# Patient Record
Sex: Male | Born: 1975 | Race: Black or African American | Hispanic: No | Marital: Single | Smoking: Current every day smoker
Health system: Southern US, Community
[De-identification: ages and names within clinical notes are randomized; demographics above are authoritative.]

## PROBLEM LIST (undated history)

## (undated) DIAGNOSIS — J45909 Unspecified asthma, uncomplicated: Secondary | ICD-10-CM

## (undated) DIAGNOSIS — F1920 Other psychoactive substance dependence, uncomplicated: Secondary | ICD-10-CM

## (undated) HISTORY — PX: HAND SURGERY: SHX662

## (undated) HISTORY — PX: ANKLE SURGERY: SHX546

---

## 2005-03-05 ENCOUNTER — Emergency Department (HOSPITAL_COMMUNITY): Admission: EM | Admit: 2005-03-05 | Discharge: 2005-03-05 | Payer: Self-pay | Admitting: Emergency Medicine

## 2005-03-15 ENCOUNTER — Emergency Department (HOSPITAL_COMMUNITY): Admission: EM | Admit: 2005-03-15 | Discharge: 2005-03-15 | Payer: Self-pay | Admitting: Emergency Medicine

## 2006-12-02 ENCOUNTER — Emergency Department: Payer: Self-pay | Admitting: Emergency Medicine

## 2008-10-22 ENCOUNTER — Emergency Department: Payer: Self-pay | Admitting: Emergency Medicine

## 2008-10-26 ENCOUNTER — Ambulatory Visit: Payer: Self-pay | Admitting: Specialist

## 2008-12-13 ENCOUNTER — Emergency Department: Payer: Self-pay | Admitting: Emergency Medicine

## 2009-01-23 ENCOUNTER — Emergency Department: Payer: Self-pay | Admitting: Emergency Medicine

## 2009-02-24 ENCOUNTER — Emergency Department: Payer: Self-pay | Admitting: Emergency Medicine

## 2009-04-15 ENCOUNTER — Emergency Department: Payer: Self-pay | Admitting: Emergency Medicine

## 2009-04-23 ENCOUNTER — Emergency Department: Payer: Self-pay | Admitting: Emergency Medicine

## 2011-01-06 ENCOUNTER — Emergency Department: Payer: Self-pay | Admitting: Emergency Medicine

## 2011-10-03 ENCOUNTER — Emergency Department: Payer: Self-pay | Admitting: Emergency Medicine

## 2012-02-23 ENCOUNTER — Emergency Department: Payer: Self-pay | Admitting: Emergency Medicine

## 2012-03-15 ENCOUNTER — Emergency Department: Payer: Self-pay | Admitting: Emergency Medicine

## 2012-08-02 ENCOUNTER — Emergency Department: Payer: Self-pay | Admitting: Emergency Medicine

## 2014-07-27 ENCOUNTER — Encounter: Payer: Self-pay | Admitting: Emergency Medicine

## 2014-07-27 ENCOUNTER — Emergency Department
Admission: EM | Admit: 2014-07-27 | Discharge: 2014-07-27 | Disposition: A | Discharge status: Home / Self Care | Payer: Self-pay | Attending: Emergency Medicine | Admitting: Emergency Medicine

## 2014-07-27 ENCOUNTER — Emergency Department: Payer: Self-pay

## 2014-07-27 DIAGNOSIS — Y9389 Activity, other specified: Secondary | ICD-10-CM | POA: Insufficient documentation

## 2014-07-27 DIAGNOSIS — Z792 Long term (current) use of antibiotics: Secondary | ICD-10-CM | POA: Insufficient documentation

## 2014-07-27 DIAGNOSIS — J218 Acute bronchiolitis due to other specified organisms: Secondary | ICD-10-CM

## 2014-07-27 DIAGNOSIS — Z720 Tobacco use: Secondary | ICD-10-CM | POA: Insufficient documentation

## 2014-07-27 DIAGNOSIS — S91332A Puncture wound without foreign body, left foot, initial encounter: Secondary | ICD-10-CM | POA: Insufficient documentation

## 2014-07-27 DIAGNOSIS — Y9289 Other specified places as the place of occurrence of the external cause: Secondary | ICD-10-CM | POA: Insufficient documentation

## 2014-07-27 DIAGNOSIS — Z79899 Other long term (current) drug therapy: Secondary | ICD-10-CM | POA: Insufficient documentation

## 2014-07-27 DIAGNOSIS — S299XXA Unspecified injury of thorax, initial encounter: Secondary | ICD-10-CM | POA: Insufficient documentation

## 2014-07-27 DIAGNOSIS — J219 Acute bronchiolitis, unspecified: Secondary | ICD-10-CM | POA: Insufficient documentation

## 2014-07-27 DIAGNOSIS — W450XXA Nail entering through skin, initial encounter: Secondary | ICD-10-CM | POA: Insufficient documentation

## 2014-07-27 DIAGNOSIS — B9789 Other viral agents as the cause of diseases classified elsewhere: Secondary | ICD-10-CM

## 2014-07-27 DIAGNOSIS — R61 Generalized hyperhidrosis: Secondary | ICD-10-CM | POA: Insufficient documentation

## 2014-07-27 DIAGNOSIS — Y998 Other external cause status: Secondary | ICD-10-CM | POA: Insufficient documentation

## 2014-07-27 LAB — URINALYSIS COMPLETE WITH MICROSCOPIC (ARMC ONLY)
BACTERIA UA: NONE SEEN
Bilirubin Urine: NEGATIVE
GLUCOSE, UA: NEGATIVE mg/dL
HGB URINE DIPSTICK: NEGATIVE
KETONES UR: NEGATIVE mg/dL
Leukocytes, UA: NEGATIVE
NITRITE: NEGATIVE
Protein, ur: NEGATIVE mg/dL
RBC / HPF: NONE SEEN RBC/hpf (ref 0–5)
SPECIFIC GRAVITY, URINE: 1.014 (ref 1.005–1.030)
pH: 6 (ref 5.0–8.0)

## 2014-07-27 LAB — BASIC METABOLIC PANEL
Anion gap: 8 (ref 5–15)
BUN: 13 mg/dL (ref 6–20)
CHLORIDE: 104 mmol/L (ref 101–111)
CO2: 25 mmol/L (ref 22–32)
Calcium: 9.1 mg/dL (ref 8.9–10.3)
Creatinine, Ser: 1.03 mg/dL (ref 0.61–1.24)
GFR calc non Af Amer: >60 mL/min (ref >60)
GLUCOSE: 91 mg/dL (ref 65–99)
POTASSIUM: 3.9 mmol/L (ref 3.5–5.1)
Sodium: 137 mmol/L (ref 135–145)

## 2014-07-27 LAB — CBC WITH DIFFERENTIAL/PLATELET
BASOS ABS: 0.2 10*3/uL — AB (ref 0–0.1)
Basophils Relative: 3 %
Eosinophils Absolute: 0.2 10*3/uL (ref 0–0.7)
Eosinophils Relative: 4 %
HEMATOCRIT: 45.2 % (ref 40.0–52.0)
HEMOGLOBIN: 15 g/dL (ref 13.0–18.0)
LYMPHS ABS: 0.7 10*3/uL — AB (ref 1.0–3.6)
Lymphocytes Relative: 14 %
MCH: 31 pg (ref 26.0–34.0)
MCHC: 33.1 g/dL (ref 32.0–36.0)
MCV: 93.7 fL (ref 80.0–100.0)
MONO ABS: 0.8 10*3/uL (ref 0.2–1.0)
MONOS PCT: 15 %
NEUTROS ABS: 3.3 10*3/uL (ref 1.4–6.5)
NEUTROS PCT: 64 %
Platelets: 193 10*3/uL (ref 150–440)
RBC: 4.83 MIL/uL (ref 4.40–5.90)
RDW: 13.8 % (ref 11.5–14.5)
WBC: 5 10*3/uL (ref 3.8–10.6)

## 2014-07-27 LAB — RAPID INFLUENZA A&B ANTIGENS (ARMC ONLY)
INFLUENZA A (ARMC): NEGATIVE
INFLUENZA A (ARMC): NEGATIVE
INFLUENZA B (ARMC): NEGATIVE

## 2014-07-27 LAB — RAPID INFLUENZA A&B ANTIGENS: Influenza B (ARMC): NEGATIVE

## 2014-07-27 MED ORDER — FLUTICASONE PROPIONATE 50 MCG/ACT NA SUSP: 1.0000 | Freq: Every day | NASAL | Status: AC

## 2014-07-27 MED ORDER — IPRATROPIUM-ALBUTEROL 0.5-2.5 (3) MG/3ML IN SOLN
RESPIRATORY_TRACT | Status: AC
  Administered 2014-07-27: 3 mL via RESPIRATORY_TRACT
  Filled 2014-07-27: qty 3

## 2014-07-27 MED ORDER — AMOXICILLIN-POT CLAVULANATE 875-125 MG PO TABS: 1.0000 | ORAL_TABLET | Freq: Two times a day (BID) | ORAL | Status: AC

## 2014-07-27 MED ORDER — PREDNISONE 10 MG PO TABS: 10.0000 mg | ORAL_TABLET | Freq: Two times a day (BID) | ORAL | Status: AC

## 2014-07-27 MED ORDER — SODIUM CHLORIDE 0.9 % IV BOLUS (SEPSIS)
1000.0000 mL | Freq: Once | INTRAVENOUS | Status: AC
  Administered 2014-07-27: 1000 mL via INTRAVENOUS

## 2014-07-27 MED ORDER — ALBUTEROL SULFATE HFA 108 (90 BASE) MCG/ACT IN AERS: 2.0000 | INHALATION_SPRAY | Freq: Four times a day (QID) | RESPIRATORY_TRACT | Status: AC | PRN

## 2014-07-27 MED ORDER — BENZONATATE 100 MG PO CAPS: 100.0000 mg | ORAL_CAPSULE | Freq: Three times a day (TID) | ORAL | Status: AC | PRN

## 2014-07-27 MED ORDER — IPRATROPIUM-ALBUTEROL 0.5-2.5 (3) MG/3ML IN SOLN
3.0000 mL | Freq: Once | RESPIRATORY_TRACT | Status: AC
  Administered 2014-07-27: 3 mL via RESPIRATORY_TRACT

## 2014-07-27 NOTE — ED Notes (Signed)
States stepped on nail with left foot. Woke up with sore throat and cough

## 2014-07-27 NOTE — Discharge Instructions (Signed)
Puncture Wound A puncture wound happens when a sharp object pokes a hole in the skin. A puncture wound can cause an infection because germs can go under the skin during the injury. HOME CARE   Change your bandage (dressing) once a day, or as told by your doctor. If the bandage sticks, soak it in water.  Keep all doctor visits as told.  Only take medicine as told by your doctor.  Take your medicine (antibiotics) as told. Finish them even if you start to feel better. You may need a tetanus shot if:  You cannot remember when you had your last tetanus shot.  You have never had a tetanus shot. If you need a tetanus shot and you choose not to have one, you may get tetanus. Sickness from tetanus can be serious. You may need a rabies shot if an animal bite caused your wound. GET HELP RIGHT AWAY IF:   Your wound is red, puffy (swollen), or painful.  You see red lines on the skin near the wound.  You have a bad smell coming from the wound or bandage.  You have yellowish-white fluid (pus) coming from the wound.  Your medicine is not working.  You notice an object in the wound.  You have a fever.  You have severe pain.  You have trouble breathing.  You feel dizzy or pass out (faint).  You keep throwing up (vomiting).  You lose feeling (numbness) in your arm or leg, or you cannot move your arm or leg.  Your problems get worse. MAKE SURE YOU:   Understand these instructions.  Will watch your condition.  Will get help right away if you are not doing well or get worse. Document Released: 12/11/2007 Document Revised: 05/26/2011 Document Reviewed: 08/20/2010 [facility] Patient Information 2015 [place]. This information is not intended to replace advice given to you by your health care provider. Make sure you discuss any questions you have with your health care provider.  Upper Respiratory Infection, Adult An upper respiratory infection (URI) is also known as the common  cold. It is often caused by a type of germ (virus). Colds are easily spread (contagious). You can pass it to others by kissing, coughing, sneezing, or drinking out of the same glass. Usually, you get better in 1 or 2 weeks.  HOME CARE   Only take medicine as told by your doctor.  Use a warm mist humidifier or breathe in steam from a hot shower.  Drink enough water and fluids to keep your pee (urine) clear or pale yellow.  Get plenty of rest.  Return to work when your temperature is back to normal or as told by your doctor. You may use a face mask and wash your hands to stop your cold from spreading. GET HELP RIGHT AWAY IF:   After the first few days, you feel you are getting worse.  You have questions about your medicine.  You have chills, shortness of breath, or brown or red spit (mucus).  You have yellow or brown snot (nasal discharge) or pain in the face, especially when you bend forward.  You have a fever, puffy (swollen) neck, pain when you swallow, or white spots in the back of your throat.  You have a bad headache, ear pain, sinus pain, or chest pain.  You have a high-pitched whistling sound when you breathe in and out (wheezing).  You have a lasting cough or cough up blood.  You have sore muscles or a stiff  neck. MAKE SURE YOU:   Understand these instructions.  Will watch your condition.  Will get help right away if you are not doing well or get worse. Document Released: 08/20/2007 Document Revised: 05/26/2011 Document Reviewed: 06/08/2013 [facility] Patient Information 2015 [place]. This information is not intended to replace advice given to you by your health care provider. Make sure you discuss any questions you have with your health care provider.  Take the prescription meds as directed.  [facility] with your provider or [facility] as needed. Increase fluids to prevent dehydration.

## 2014-07-27 NOTE — ED Provider Notes (Signed)
[facility] Provider [facility]? ____________________________________________ ? Time seen: 1141 ? I have reviewed the triage vital signs and the nursing notes. ________ HISTORY ? Chief Complaint URI  HPI  Johnny Thompson is a 39 y.o. male who reports to the ED with 2 separate complaints. First he notes injury to the plantar surface of his left foot, after he stepped on a nail, while mowing the lawn on Tuesday. He notes a small wound to the bottom of the foot between the second and third toes. Bleeding is controlled he notes some minimal pain there and also reports that his tetanus is up-to-date. Secondly, he notes sudden onset on Wednesday morning of URI symptoms. He reports sore throat, cough, chest discomfort, and cold sweats. Gives a history of bronchitis in his past. He denies any fever, chills, sweats, but reports at least 2 episodes of loose bowel movements.  Review of Systems  Constitutional: Negative for fever. Eyes: Negative for visual changes. ENT: Negative for sore throat. Cardiovascular: Negative for chest pain. Respiratory: Negative for shortness of breath. Gastrointestinal: Negative for abdominal pain, vomiting and positive for diarrhea. Musculoskeletal: Negative for back pain. Skin: Negative for rash. Positive for foot puncture wound. Neurological: Negative for headaches, focal weakness or numbness.  10-point ROS otherwise negative. ____________________________________________  History reviewed. No pertinent past medical history.  There are no active problems to display for this patient.  History reviewed. No pertinent past surgical history. ? Current Outpatient Rx  Name  Route  Sig  Dispense  Refill  . albuterol (PROVENTIL HFA;VENTOLIN HFA) 108 (90 BASE) MCG/ACT inhaler   Inhalation   Inhale 2 puffs into the lungs every 6 (six) hours as needed for wheezing or shortness of breath.   1 Inhaler   0   .  amoxicillin-clavulanate (AUGMENTIN) 875-125 MG per tablet   Oral   Take 1 tablet by mouth 2 (two) times daily.   20 tablet   0   . benzonatate (TESSALON PERLES) 100 MG capsule   Oral   Take 1 capsule (100 mg total) by mouth 3 (three) times daily as needed for cough (Take 1-2 per dose).   30 capsule   0   . fluticasone (FLONASE) 50 MCG/ACT nasal spray   Each Nare   Place 1 spray into both nostrils daily.   16 g   0   . predniSONE (DELTASONE) 10 MG tablet   Oral   Take 1 tablet (10 mg total) by mouth 2 (two) times daily with a meal.   10 tablet   0   ? Allergies Review of patient's allergies indicates no known allergies. ? History reviewed. No pertinent family history. ? Social History History  Substance Use Topics  . Smoking status: Current Every Day Smoker  . Smokeless tobacco: Not on file  . Alcohol Use: Yes     Comment: occassional   PHYSICAL EXAM:  VITAL SIGNS: ED Triage Vitals  Enc Vitals Group     BP 07/27/14 1028 131/69 mmHg     Pulse Rate 07/27/14 1028 76     Resp 07/27/14 1028 20     Temp 07/27/14 1028 98.1 F (36.7 C)     Temp Source 07/27/14 1028 Oral     SpO2 07/27/14 1028 99 %     Weight 07/27/14 1028 190 lb (86.183 kg)     Height 07/27/14 1028 5\' 11"  (1.803 m)     Head Cir --      Peak Flow --  Pain Score 07/27/14 1029 8     Pain Loc --      Pain Edu? --      Excl. in GC? --    Constitutional: Alert and oriented. Well appearing and in no distress. Eyes: Conjunctivae are normal. PERRL. Normal extraocular movements. ENT   Head: Normocephalic and atraumatic.   Nose: No congestion/rhinnorhea.   Mouth/Throat: Mucous membranes are moist.      Ears: Normal external exam. Canals clear. TMs clear bilaterally.   Neck: Supple. No lymphadenopathy. Cardiovascular: Normal rate, regular rhythm. Normal and symmetric distal pulses are present in all extremities. No murmurs, rubs, or gallops. Respiratory: Normal respiratory effort without  tachypnea nor retractions. Breath sounds are clear and equal bilaterally. No wheezes/rales/rhonchi. Gastrointestinal: Soft and nontender.  Musculoskeletal: Normal range of motion in all extremities.  Neurologic:  Normal speech and language. No gross focal neurologic deficits are appreciated.  Skin:  Skin is warm, dry and intact. No rash noted. Left plantar surface with 0.5cm wound with local honey-crusted scabbing noted.  No local erythema, phlebitis, or lymphangitis.  Psychiatric: Mood and affect are normal. Patient exhibits appropriate insight and judgment. ___________ RADIOLOGY  IMPRESSION: Slight bronchitic changes. _____________ PROCEDURES ? Procedure(s) performed: DuoNeb x 1             NS 1000 mg bolus x 1 Critical Care performed: No _______________________________  Labs Reviewed  URINALYSIS COMPLETEWITH MICROSCOPIC ([facility])  - Abnormal; Notable for the following:    Color, Urine YELLOW (*)    APPearance CLEAR (*)    Squamous Epithelial / LPF 0-5 (*)    All other components within normal limits  CBC WITH DIFFERENTIAL/PLATELET - Abnormal; Notable for the following:    Lymphs Abs 0.7 (*)    Basophils Absolute 0.2 (*)    All other components within normal limits  INFLUENZA A&B ANTIGENS([facility])  BASIC METABOLIC PANEL  _______________________ INITIAL IMPRESSION / ASSESSMENT AND PLAN / ED COURSE  Negative CXR and lab, and flu test results to patient. Patient reports improvement to symptoms following breathing treatment and fluid bolus.  Discussed treatment and management of puncture wound with antibiotic and wound care.  [facility] to [facility] or return as needed.  Pertinent labs & imaging results that were available during my care of the patient were reviewed by me and considered in my medical decision making (see chart for details).  ___________________________________________ FINAL CLINICAL IMPRESSION(S) / ED DIAGNOSES?  Final diagnoses:  Puncture wound of  foot, left, initial encounter  Acute viral bronchiolitis      Lissa HoardJenise V Bacon Allicia Culley, PA-C 07/27/14 1609  Darien Ramusavid W Kaminski, MD 08/01/14 [phone]

## 2014-07-27 NOTE — ED Notes (Signed)
Patient transported to X-ray 

## 2015-03-17 ENCOUNTER — Emergency Department
Admission: EM | Admit: 2015-03-17 | Discharge: 2015-03-17 | Disposition: A | Discharge status: Home / Self Care | Payer: Self-pay | Attending: Emergency Medicine | Admitting: Emergency Medicine

## 2015-03-17 ENCOUNTER — Emergency Department: Payer: Self-pay

## 2015-03-17 ENCOUNTER — Encounter: Payer: Self-pay | Admitting: Emergency Medicine

## 2015-03-17 DIAGNOSIS — S9031XA Contusion of right foot, initial encounter: Secondary | ICD-10-CM | POA: Insufficient documentation

## 2015-03-17 DIAGNOSIS — Y998 Other external cause status: Secondary | ICD-10-CM | POA: Insufficient documentation

## 2015-03-17 DIAGNOSIS — Y9389 Activity, other specified: Secondary | ICD-10-CM | POA: Insufficient documentation

## 2015-03-17 DIAGNOSIS — S060X9A Concussion with loss of consciousness of unspecified duration, initial encounter: Secondary | ICD-10-CM | POA: Insufficient documentation

## 2015-03-17 DIAGNOSIS — Z7952 Long term (current) use of systemic steroids: Secondary | ICD-10-CM | POA: Insufficient documentation

## 2015-03-17 DIAGNOSIS — S60221A Contusion of right hand, initial encounter: Secondary | ICD-10-CM | POA: Insufficient documentation

## 2015-03-17 DIAGNOSIS — Z7951 Long term (current) use of inhaled steroids: Secondary | ICD-10-CM | POA: Insufficient documentation

## 2015-03-17 DIAGNOSIS — F17200 Nicotine dependence, unspecified, uncomplicated: Secondary | ICD-10-CM | POA: Insufficient documentation

## 2015-03-17 DIAGNOSIS — S4992XA Unspecified injury of left shoulder and upper arm, initial encounter: Secondary | ICD-10-CM | POA: Insufficient documentation

## 2015-03-17 DIAGNOSIS — Z792 Long term (current) use of antibiotics: Secondary | ICD-10-CM | POA: Insufficient documentation

## 2015-03-17 DIAGNOSIS — S0990XA Unspecified injury of head, initial encounter: Secondary | ICD-10-CM

## 2015-03-17 DIAGNOSIS — Y92481 Parking lot as the place of occurrence of the external cause: Secondary | ICD-10-CM | POA: Insufficient documentation

## 2015-03-17 DIAGNOSIS — S0083XA Contusion of other part of head, initial encounter: Secondary | ICD-10-CM | POA: Insufficient documentation

## 2015-03-17 DIAGNOSIS — S199XXA Unspecified injury of neck, initial encounter: Secondary | ICD-10-CM | POA: Insufficient documentation

## 2015-03-17 LAB — BASIC METABOLIC PANEL
ANION GAP: 11 (ref 5–15)
BUN: 21 mg/dL — AB (ref 6–20)
CHLORIDE: 109 mmol/L (ref 101–111)
CO2: 21 mmol/L — ABNORMAL LOW (ref 22–32)
Calcium: 9 mg/dL (ref 8.9–10.3)
Creatinine, Ser: 1.25 mg/dL — ABNORMAL HIGH (ref 0.61–1.24)
GFR calc Af Amer: >60 mL/min (ref >60)
GFR calc non Af Amer: >60 mL/min (ref >60)
Glucose, Bld: 88 mg/dL (ref 65–99)
POTASSIUM: 3.9 mmol/L (ref 3.5–5.1)
SODIUM: 141 mmol/L (ref 135–145)

## 2015-03-17 LAB — CBC WITH DIFFERENTIAL/PLATELET
BASOS PCT: 1 %
Basophils Absolute: 0.1 10*3/uL (ref 0–0.1)
EOS PCT: 4 %
Eosinophils Absolute: 0.3 10*3/uL (ref 0–0.7)
HCT: 47.4 % (ref 40.0–52.0)
Hemoglobin: 15.9 g/dL (ref 13.0–18.0)
Lymphocytes Relative: 32 %
Lymphs Abs: 2.5 10*3/uL (ref 1.0–3.6)
MCH: 31.2 pg (ref 26.0–34.0)
MCHC: 33.5 g/dL (ref 32.0–36.0)
MCV: 93.1 fL (ref 80.0–100.0)
MONO ABS: 0.7 10*3/uL (ref 0.2–1.0)
MONOS PCT: 10 %
NEUTROS ABS: 4 10*3/uL (ref 1.4–6.5)
Neutrophils Relative %: 53 %
Platelets: 232 10*3/uL (ref 150–440)
RBC: 5.09 MIL/uL (ref 4.40–5.90)
RDW: 14 % (ref 11.5–14.5)
WBC: 7.6 10*3/uL (ref 3.8–10.6)

## 2015-03-17 MED ORDER — OXYCODONE-ACETAMINOPHEN 5-325 MG PO TABS: 1.0000 | ORAL_TABLET | Freq: Once | ORAL | Status: DC

## 2015-03-17 MED ORDER — SODIUM CHLORIDE 0.9 % IV BOLUS (SEPSIS)
1000.0000 mL | Freq: Once | INTRAVENOUS | Status: AC
  Administered 2015-03-17: 1000 mL via INTRAVENOUS

## 2015-03-17 MED ORDER — FENTANYL CITRATE (PF) 100 MCG/2ML IJ SOLN
100.0000 ug | Freq: Once | INTRAMUSCULAR | Status: AC
  Administered 2015-03-17: 100 ug via INTRAVENOUS
  Filled 2015-03-17: qty 2

## 2015-03-17 MED ORDER — OXYCODONE-ACETAMINOPHEN 5-325 MG PO TABS: 1.0000 | ORAL_TABLET | Freq: Four times a day (QID) | ORAL | Status: AC | PRN
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
## 2015-03-17 NOTE — ED Notes (Signed)
Pt refused shoulder x-ray
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
## 2015-03-17 NOTE — Discharge Instructions (Signed)
Contusion A contusion is a deep bruise. Contusions are the result of a blunt injury to tissues and muscle fibers under the skin. The injury causes bleeding under the skin. The skin overlying the contusion may turn blue, purple, or yellow. Minor injuries will give you a painless contusion, but more severe contusions may stay painful and swollen for a few weeks.  CAUSES  This condition is usually caused by a blow, trauma, or direct force to an area of the body. SYMPTOMS  Symptoms of this condition include:  Swelling of the injured area.  Pain and tenderness in the injured area.  Discoloration. The area may have redness and then turn blue, purple, or yellow. DIAGNOSIS  This condition is diagnosed based on a physical exam and medical history. An X-ray, CT scan, or MRI may be needed to determine if there are any associated injuries, such as broken bones (fractures). TREATMENT  Specific treatment for this condition depends on what area of the body was injured. In general, the best treatment for a contusion is resting, icing, applying pressure to (compression), and elevating the injured area. This is often called the RICE strategy. Over-the-counter anti-inflammatory medicines may also be recommended for pain control.  HOME CARE INSTRUCTIONS   Rest the injured area.  If directed, apply ice to the injured area:  Put ice in a plastic bag.  Place a towel between your skin and the bag.  Leave the ice on for 20 minutes, 2-3 times per day.  If directed, apply light compression to the injured area using an elastic bandage. Make sure the bandage is not wrapped too tightly. Remove and reapply the bandage as directed by your health care provider.  If possible, raise (elevate) the injured area above the level of your heart while you are sitting or lying down.  Take over-the-counter and prescription medicines only as told by your health care provider. SEEK MEDICAL CARE IF:  Your symptoms do not  improve after several days of treatment.  Your symptoms get worse.  You have difficulty moving the injured area. SEEK IMMEDIATE MEDICAL CARE IF:   You have severe pain.  You have numbness in a hand or foot.  Your hand or foot turns pale or cold.   This information is not intended to replace advice given to you by your health care provider. Make sure you discuss any questions you have with your health care provider.   Document Released: 12/11/2004 Document Revised: 11/22/2014 Document Reviewed: 07/19/2014 Elsevier Interactive Patient Education 2016 [employer].  Concussion, Adult A concussion is a brain injury. It is caused by:  A hit to the head.  A quick and sudden movement (jolt) of the head or neck. A concussion is usually not life threatening. Even so, it can cause serious problems. If you had a concussion before, you may have concussion-like problems after a hit to your head. HOME CARE General Instructions  Follow your doctor's directions carefully.  Take medicines only as told by your doctor.  Only take medicines your doctor says are safe.  Do not drink alcohol until your doctor says it is okay. Alcohol and some drugs can slow down healing. They can also put you at risk for further injury.  If you are having trouble remembering things, write them down.  Try to do one thing at a time if you get distracted easily. For example, do not watch TV while making dinner.  Talk to your family members or close friends when making important decisions.  Follow up with your doctor as told.  Watch your symptoms. Tell others to do the same. Serious problems can sometimes happen after a concussion. Older adults are more likely to have these problems.  Tell your teachers, school nurse, school counselor, coach, Event organiserathletic trainer, or work Production designer, theatre/television/filmmanager about your concussion. Tell them about what you can or cannot do. They should watch to see if:  It gets even harder for you to pay  attention or concentrate.  It gets even harder for you to remember things or learn new things.  You need more time than normal to finish things.  You become annoyed (irritable) more than before.  You are not able to deal with stress as well.  You have more problems than before.  Rest. Make sure you:  Get plenty of sleep at night.  Go to sleep early.  Go to bed at the same time every day. Try to wake up at the same time.  Rest during the day.  Take naps when you feel tired.  Limit activities where you have to think a lot or concentrate. These include:  Doing homework.  Doing work related to a job.  Watching TV.  Using the computer. Returning To Your Regular Activities Return to your normal activities slowly, not all at once. You must give your body and brain enough time to heal.   Do not play sports or do other athletic activities until your doctor says it is okay.  Ask your doctor when you can drive, ride a bicycle, or work other vehicles or machines. Never do these things if you feel dizzy.  Ask your doctor about when you can return to work or school. Preventing Another Concussion It is very important to avoid another brain injury, especially before you have healed. In rare cases, another injury can lead to permanent brain damage, brain swelling, or death. The risk of this is greatest during the first 7-10 days after your injury. Avoid injuries by:   Wearing a seat belt when riding in a car.  Not drinking too much alcohol.  Avoiding activities that could lead to a second concussion (such as contact sports).  Wearing a helmet when doing activities like:  Biking.  Skiing.  Skateboarding.  Skating.  Making your home safer by:  Removing things from the floor or stairways that could make you trip.  Using grab bars in bathrooms and handrails by stairs.  Placing non-slip mats on floors and in bathtubs.  Improve lighting in dark areas. GET HELP IF:  It  gets even harder for you to pay attention or concentrate.  It gets even harder for you to remember things or learn new things.  You need more time than normal to finish things.  You become annoyed (irritable) more than before.  You are not able to deal with stress as well.  You have more problems than before.  You have problems keeping your balance.  You are not able to react quickly when you should. Get help if you have any of these problems for more than 2 weeks:   Lasting (chronic) headaches.  Dizziness or trouble balancing.  Feeling sick to your stomach (nausea).  Seeing (vision) problems.  Being affected by noises or light more than normal.  Feeling sad, low, down in the dumps, blue, gloomy, or empty (depressed).  Mood changes (mood swings).  Feeling of fear or nervousness about what may happen (anxiety).  Feeling annoyed.  Memory problems.  Problems concentrating or paying attention.  Sleep problems.  Feeling tired all the time. GET HELP RIGHT AWAY IF:   You have bad headaches or your headaches get worse.  You have weakness (even if it is in one hand, leg, or part of the face).  You have loss of feeling (numbness).  You feel off balance.  You keep throwing up (vomiting).  You feel tired.  One black center of your eye (pupil) is larger than the other.  You twitch or shake violently (convulse).  Your speech is not clear (slurred).  You are more confused, easily angered (agitated), or annoyed than before.  You have more trouble resting than before.  You are unable to recognize people or places.  You have neck pain.  It is difficult to wake you up.  You have unusual behavior changes.  You pass out (lose consciousness). MAKE SURE YOU:   Understand these instructions.  Will watch your condition.  Will get help right away if you are not doing well or get worse.   This information is not intended to replace advice given to you by your  health care provider. Make sure you discuss any questions you have with your health care provider.   Document Released: 02/19/2009 Document Revised: 03/24/2014 Document Reviewed: 09/23/2012 Elsevier Interactive Patient Education [employer]

## 2017-02-06 ENCOUNTER — Emergency Department: Payer: Self-pay

## 2017-02-06 ENCOUNTER — Other Ambulatory Visit: Payer: Self-pay

## 2017-02-06 ENCOUNTER — Emergency Department
Admission: EM | Admit: 2017-02-06 | Discharge: 2017-02-06 | Disposition: A | Discharge status: Home / Self Care | Payer: Self-pay | Attending: Emergency Medicine | Admitting: Emergency Medicine

## 2017-02-06 ENCOUNTER — Encounter: Payer: Self-pay | Admitting: *Deleted

## 2017-02-06 DIAGNOSIS — F17210 Nicotine dependence, cigarettes, uncomplicated: Secondary | ICD-10-CM | POA: Insufficient documentation

## 2017-02-06 DIAGNOSIS — R079 Chest pain, unspecified: Secondary | ICD-10-CM | POA: Insufficient documentation

## 2017-02-06 DIAGNOSIS — J45909 Unspecified asthma, uncomplicated: Secondary | ICD-10-CM | POA: Insufficient documentation

## 2017-02-06 DIAGNOSIS — Z79899 Other long term (current) drug therapy: Secondary | ICD-10-CM | POA: Insufficient documentation

## 2017-02-06 HISTORY — DX: Unspecified asthma, uncomplicated: J45.909

## 2017-02-06 HISTORY — DX: Other psychoactive substance dependence, uncomplicated: F19.20

## 2017-02-06 LAB — CBC
HEMATOCRIT: 47.9 % (ref 40.0–52.0)
Hemoglobin: 16.3 g/dL (ref 13.0–18.0)
MCH: 31.4 pg (ref 26.0–34.0)
MCHC: 34 g/dL (ref 32.0–36.0)
MCV: 92.4 fL (ref 80.0–100.0)
PLATELETS: 249 10*3/uL (ref 150–440)
RBC: 5.18 MIL/uL (ref 4.40–5.90)
RDW: 13.8 % (ref 11.5–14.5)
WBC: 4.8 10*3/uL (ref 3.8–10.6)

## 2017-02-06 LAB — URINE DRUG SCREEN, QUALITATIVE (ARMC ONLY)
Amphetamines, Ur Screen: NOT DETECTED
BARBITURATES, UR SCREEN: NOT DETECTED
BENZODIAZEPINE, UR SCRN: NOT DETECTED
Cannabinoid 50 Ng, Ur ~~LOC~~: POSITIVE — AB
Cocaine Metabolite,Ur ~~LOC~~: POSITIVE — AB
MDMA (Ecstasy)Ur Screen: NOT DETECTED
Methadone Scn, Ur: NOT DETECTED
Opiate, Ur Screen: NOT DETECTED
Phencyclidine (PCP) Ur S: NOT DETECTED
TRICYCLIC, UR SCREEN: NOT DETECTED

## 2017-02-06 LAB — BASIC METABOLIC PANEL
Anion gap: 9 (ref 5–15)
BUN: 21 mg/dL — ABNORMAL HIGH (ref 6–20)
CO2: 22 mmol/L (ref 22–32)
CREATININE: 1 mg/dL (ref 0.61–1.24)
Calcium: 9.1 mg/dL (ref 8.9–10.3)
Chloride: 109 mmol/L (ref 101–111)
GFR calc Af Amer: >60 mL/min (ref >60)
Glucose, Bld: 107 mg/dL — ABNORMAL HIGH (ref 65–99)
POTASSIUM: 4.3 mmol/L (ref 3.5–5.1)
SODIUM: 140 mmol/L (ref 135–145)

## 2017-02-06 LAB — TROPONIN I: Troponin I: <0.03 ng/mL (ref <0.03)

## 2017-02-06 NOTE — ED Notes (Signed)
Pt states that he was drinking and smoking reefer and did not feel right after doing so. He reports that he developed chest pain and has not been able to sleep since 0400 this am. He states that he usually does this but this is the first time it had made him feel bad. He is anxious.

## 2017-02-06 NOTE — ED Triage Notes (Signed)
Pt c/o chest pain, throat swelling, dyspnea, cough. Pt states sxs started at 0400. Pt states he was drinking ETOH, cocaine, marijuana and some pill of unknown type and origin. Pt states heart racing, throat swelling, cough and chest pain w/ inspiration all started at the same time. Pt has not slept since sxs started.

## 2017-02-06 NOTE — Discharge Instructions (Signed)
Please seek medical attention for any high fevers, chest pain, shortness of breath, change in behavior, persistent vomiting, bloody stool or any other new or concerning symptoms.  

## 2017-02-06 NOTE — ED Provider Notes (Signed)
[facility] Emergency Department Provider Note ___________________________________   I have reviewed the triage vital signs and the nursing notes.   HISTORY  Chief Complaint Chest Pain   History limited by: Not Limited   HPI Johnny Thompson is a 41 y.o. male who presents to the emergency department today because of concern for chest pain.   LOCATION:left chest DURATION:~13 hours TIMING: constant SEVERITY: severe QUALITY: pressure CONTEXT: patient states that he was drinking, smoking marijuana and using cocaine last night. Also took some unknown pills. States he also has heart burn. MODIFYING FACTORS: worse with deep breaths, worse with turning onto his left side ASSOCIATED SYMPTOMS: denies any shortness of breath. Denies nausea and vomiting. Denies fever.  Per medical record review patient has a history of polysubstance abuse.   Past Medical History:  Diagnosis Date  . Asthma   . Polysubstance (excluding opioids) dependence (HCC)     There are no active problems to display for this patient.   Past Surgical History:  Procedure Laterality Date  . ANKLE SURGERY    . HAND SURGERY      Prior to Admission medications   Medication Sig Start Date End Date Taking? Authorizing Provider  acetaminophen (TYLENOL) 500 MG tablet Take 1,000 mg by mouth every 6 (six) hours as needed for mild pain or moderate pain.    [provider]  albuterol (PROVENTIL HFA;VENTOLIN HFA) 108 (90 BASE) MCG/ACT inhaler Inhale 2 puffs into the lungs every 6 (six) hours as needed for wheezing or shortness of breath. 07/27/14   Menshew, Charlesetta IvoryJenise V Bacon, PA-C  amoxicillin-clavulanate (AUGMENTIN) 875-125 MG per tablet Take 1 tablet by mouth 2 (two) times daily. 07/27/14   Menshew, Charlesetta IvoryJenise V Bacon, PA-C  benzonatate (TESSALON PERLES) 100 MG capsule Take 1 capsule (100 mg total) by mouth 3 (three) times daily as needed for cough (Take 1-2 per dose). 07/27/14   Menshew, Charlesetta IvoryJenise V  Bacon, PA-C  fluticasone (FLONASE) 50 MCG/ACT nasal spray Place 1 spray into both nostrils daily. 07/27/14   Menshew, Charlesetta IvoryJenise V Bacon, PA-C  oxyCODONE-acetaminophen (ROXICET) 5-325 MG tablet Take 1 tablet by mouth every 6 (six) hours as needed. 03/17/15   Darien RamusKaminski, David W, MD  predniSONE (DELTASONE) 10 MG tablet Take 1 tablet (10 mg total) by mouth 2 (two) times daily with a meal. 07/27/14   Menshew, Charlesetta IvoryJenise V Bacon, PA-C    Allergies Patient has no known allergies.  History reviewed. No pertinent family history.  Social History Social History   Tobacco Use  . Smoking status: Current Every Day Smoker  . Smokeless tobacco: Never Used  Substance Use Topics  . Alcohol use: Yes    Comment: last use 02/06/17  . Drug use: Yes    Types: Cocaine, Marijuana    Comment: pills    Review of Systems Constitutional: No fever/chills Eyes: No visual changes. ENT: No sore throat. Cardiovascular: Positive for chest.  Respiratory: Denies shortness of breath. Gastrointestinal: No abdominal pain.  No nausea, no vomiting.  No diarrhea.   Genitourinary: Negative for dysuria. Musculoskeletal: Negative for back pain. Skin: Negative for rash. Neurological: Negative for headaches, focal weakness or numbness.  ____________________________________________   PHYSICAL EXAM:  VITAL SIGNS: ED Triage Vitals  Enc Vitals Group     BP 02/06/17 1539 137/82     Pulse Rate 02/06/17 1539 92     Resp 02/06/17 1539 20     Temp 02/06/17 1539 98.2 F (36.8 C)     Temp Source 02/06/17  1539 Oral     SpO2 02/06/17 1539 98 %     Weight 02/06/17 1539 195 lb (88.5 kg)     Height 02/06/17 1539 5\' 11"  (1.803 m)     Head Circumference --      Peak Flow --      Pain Score 02/06/17 1536 8   Constitutional: Alert and oriented. Well appearing and in no distress. Eyes: Conjunctivae are normal.  ENT   Head: Normocephalic and atraumatic.   Nose: No congestion/rhinnorhea.   Mouth/Throat: Mucous membranes are  moist.   Neck: No stridor. Hematological/Lymphatic/Immunilogical: No cervical lymphadenopathy. Cardiovascular: Normal rate, regular rhythm.  No murmurs, rubs, or gallops. Respiratory: Normal respiratory effort without tachypnea nor retractions. Breath sounds are clear and equal bilaterally. No wheezes/rales/rhonchi. Gastrointestinal: Soft and non tender. No rebound. No guarding.  Genitourinary: Deferred Musculoskeletal: Normal range of motion in all extremities. Positive for tenderness to palpation over left chest. Patient states this does reproduce his pain. Neurologic:  Normal speech and language. No gross focal neurologic deficits are appreciated.  Skin:  Skin is warm, dry and intact. No rash noted. Psychiatric: Mood and affect are normal. Speech and behavior are normal. Patient exhibits appropriate insight and judgment.  ____________________________________________    LABS (pertinent positives/negatives)  Trop <0.03 CBC wnl BMP glu 107, cr 1.00 UDS positive for cocaine and cannabinoid  ____________________________________________   EKG  I, Phineas SemenGraydon Donnis Pecha, attending physician, personally viewed and interpreted this EKG  EKG Time: 1528 Rate: 88 Rhythm: normal sinus rhythm Axis: left axis deviation Intervals: qtc 413 QRS: narrow, q waves V1 ST changes: no st elevation Impression: abnormal ekg   ____________________________________________    RADIOLOGY  CXR No acute disease  ____________________________________________   PROCEDURES  Procedures  ____________________________________________   INITIAL IMPRESSION / ASSESSMENT AND PLAN / ED COURSE  Pertinent labs & imaging results that were available during my care of the patient were reviewed by me and considered in my medical decision making (see chart for details).  Differential diagnosis includes, but is not limited to, ACS, aortic dissection, pulmonary embolism, cardiac tamponade, pneumothorax,  pneumonia, pericarditis, myocarditis, GI-related causes including esophagitis/gastritis, and musculoskeletal chest wall pain.   Patient's work up without concerning findings. The patient was tender to palpation over the chest wall raising possibility of costochondritis. Discussed the findings with the patient. Also discussed importance of stopping substance abuse and follow up with primary care physician.   ____________________________________________   FINAL CLINICAL IMPRESSION(S) / ED DIAGNOSES  Final diagnoses:  Nonspecific chest pain     Note: This dictation was prepared with Dragon dictation. Any transcriptional errors that result from this process are unintentional     Phineas SemenGoodman, Willo Yoon, MD 02/06/17 1710

## 2017-02-06 NOTE — ED Notes (Signed)
Pt ambulatory without difficulty to POV with S/O. NAD. VSS. resp non-labored and equal. Skin color WNL warm and dry.No questions or concerns voiced during discharge instructions.

## 2017-08-13 ENCOUNTER — Encounter: Payer: Self-pay | Admitting: Emergency Medicine

## 2017-08-13 ENCOUNTER — Emergency Department
Admission: EM | Admit: 2017-08-13 | Discharge: 2017-08-13 | Disposition: A | Discharge status: Home / Self Care | Payer: Self-pay | Attending: Emergency Medicine | Admitting: Emergency Medicine

## 2017-08-13 ENCOUNTER — Emergency Department: Payer: Self-pay

## 2017-08-13 ENCOUNTER — Other Ambulatory Visit: Payer: Self-pay

## 2017-08-13 DIAGNOSIS — Y999 Unspecified external cause status: Secondary | ICD-10-CM | POA: Insufficient documentation

## 2017-08-13 DIAGNOSIS — J45909 Unspecified asthma, uncomplicated: Secondary | ICD-10-CM | POA: Insufficient documentation

## 2017-08-13 DIAGNOSIS — Z79899 Other long term (current) drug therapy: Secondary | ICD-10-CM | POA: Insufficient documentation

## 2017-08-13 DIAGNOSIS — Y929 Unspecified place or not applicable: Secondary | ICD-10-CM | POA: Insufficient documentation

## 2017-08-13 DIAGNOSIS — Y939 Activity, unspecified: Secondary | ICD-10-CM | POA: Insufficient documentation

## 2017-08-13 DIAGNOSIS — F17200 Nicotine dependence, unspecified, uncomplicated: Secondary | ICD-10-CM | POA: Insufficient documentation

## 2017-08-13 DIAGNOSIS — W298XXA Contact with other powered powered hand tools and household machinery, initial encounter: Secondary | ICD-10-CM | POA: Insufficient documentation

## 2017-08-13 DIAGNOSIS — S61031A Puncture wound without foreign body of right thumb without damage to nail, initial encounter: Secondary | ICD-10-CM | POA: Insufficient documentation

## 2017-08-13 MED ORDER — LIDOCAINE HCL (PF) 1 % IJ SOLN
5.0000 mL | Freq: Once | INTRAMUSCULAR | Status: AC
  Administered 2017-08-13: 5 mL

## 2017-08-13 MED ORDER — TETANUS-DIPHTH-ACELL PERTUSSIS 5-2.5-18.5 LF-MCG/0.5 IM SUSP
0.5000 mL | Freq: Once | INTRAMUSCULAR | Status: AC
Start: 2017-08-13 — End: 2017-08-13
  Administered 2017-08-13: 0.5 mL via INTRAMUSCULAR
  Filled 2017-08-13: qty 0.5

## 2017-08-13 MED ORDER — LIDOCAINE HCL (PF) 1 % IJ SOLN
INTRAMUSCULAR | Status: AC
  Administered 2017-08-13: 5 mL
  Filled 2017-08-13: qty 5

## 2017-08-13 MED ORDER — TRAMADOL HCL 50 MG PO TABS: 50.0000 mg | ORAL_TABLET | Freq: Two times a day (BID) | ORAL | 0 refills | Status: DC | PRN

## 2017-08-13 MED ORDER — TRAMADOL HCL 50 MG PO TABS
50.0000 mg | ORAL_TABLET | Freq: Once | ORAL | Status: AC
  Administered 2017-08-13: 50 mg via ORAL
  Filled 2017-08-13: qty 1

## 2017-08-13 NOTE — ED Provider Notes (Signed)
[facility] Emergency Department Provider Note   ____________________________________________   First MD Initiated Contact with Patient 08/13/17 [phone]     (approximate)  I have reviewed the triage vital signs and the nursing notes.   HISTORY  Chief Complaint Finger Injury    HPI Johnny Thompson is a 42 y.o. male patient presents with a puncture wound to the right thumb.  Patient state he was using a drill and bit slipped and went into his thumb.  Patient denies loss of sensation or loss of function of the thumb.  No damage to the nail.  Patient rates pain as a 9/10.  Patient is right-hand dominant.  Patient described the pain is "sharp".  No palates this measures prior to arrival.  No hemorrhaging.  Tetanus shot is not up-to-date.  Past Medical History:  Diagnosis Date  . Asthma   . Polysubstance (excluding opioids) dependence (HCC)     There are no active problems to display for this patient.   Past Surgical History:  Procedure Laterality Date  . ANKLE SURGERY    . HAND SURGERY      Prior to Admission medications   Medication Sig Start Date End Date Taking? Authorizing Provider  acetaminophen (TYLENOL) 500 MG tablet Take 1,000 mg by mouth every 6 (six) hours as needed for mild pain or moderate pain.    [provider]  albuterol (PROVENTIL HFA;VENTOLIN HFA) 108 (90 BASE) MCG/ACT inhaler Inhale 2 puffs into the lungs every 6 (six) hours as needed for wheezing or shortness of breath. 07/27/14   Menshew, Charlesetta Ivory, PA-C  amoxicillin-clavulanate (AUGMENTIN) 875-125 MG per tablet Take 1 tablet by mouth 2 (two) times daily. 07/27/14   Menshew, Charlesetta Ivory, PA-C  benzonatate (TESSALON PERLES) 100 MG capsule Take 1 capsule (100 mg total) by mouth 3 (three) times daily as needed for cough (Take 1-2 per dose). 07/27/14   Menshew, Charlesetta Ivory, PA-C  fluticasone (FLONASE) 50 MCG/ACT nasal spray Place 1 spray into both nostrils daily. 07/27/14    Menshew, Charlesetta Ivory, PA-C  oxyCODONE-acetaminophen (ROXICET) 5-325 MG tablet Take 1 tablet by mouth every 6 (six) hours as needed. 03/17/15   Darien Ramus, MD  predniSONE (DELTASONE) 10 MG tablet Take 1 tablet (10 mg total) by mouth 2 (two) times daily with a meal. 07/27/14   Menshew, Charlesetta Ivory, PA-C  traMADol (ULTRAM) 50 MG tablet Take 1 tablet (50 mg total) by mouth every 12 (twelve) hours as needed. 08/13/17   Joni Reining, PA-C    Allergies Patient has no known allergies.  No family history on file.  Social History Social History   Tobacco Use  . Smoking status: Current Every Day Smoker  . Smokeless tobacco: Never Used  Substance Use Topics  . Alcohol use: Yes    Comment: last use 02/06/17  . Drug use: Not Currently    Types: Cocaine, Marijuana    Comment: pills    Review of Systems Constitutional: No fever/chills Eyes: No visual changes. ENT: No sore throat. Cardiovascular: Denies chest pain. Respiratory: Denies shortness of breath. Gastrointestinal: No abdominal pain.  No nausea, no vomiting.  No diarrhea.  No constipation. Genitourinary: Negative for dysuria. Musculoskeletal: Negative for back pain. Skin: Negative for rash.  Puncture wound right thumb Neurological: Negative for headaches, focal weakness or numbness. Psychiatric:Polysubstance abuse ____________________________________________   PHYSICAL EXAM:  VITAL SIGNS: ED Triage Vitals  Enc Vitals Group     BP 08/13/17 0756 Marland Kitchen)  153/110     Pulse Rate 08/13/17 0756 64     Resp 08/13/17 0756 16     Temp 08/13/17 0756 97.7 F (36.5 C)     Temp Source 08/13/17 0756 Oral     SpO2 08/13/17 0756 98 %     Weight 08/13/17 0757 195 lb (88.5 kg)     Height 08/13/17 0757  (1.803 m)     Head Circumference --      Peak Flow --      Pain Score 08/13/17 0756 9     Pain Loc --      Pain Edu? --      Excl. in GC? --     Constitutional: Alert and oriented. Well appearing and in no acute  distress.  Anxious. Cardiovascular: Normal rate, regular rhythm. Grossly normal heart sounds.  Good peripheral circulation.  Elevated blood pressure. Respiratory: Normal respiratory effort.  No  Neurologic:  Normal speech and language. No gross focal neurologic deficits are appreciated. No gait instability. Skin: Small puncture wound palmar aspect distal right thumb.   Psychiatric: Mood and affect are normal. Speech and behavior are normal.  ____________________________________________   LABS (all labs ordered are listed, but only abnormal results are displayed)  Labs Reviewed - No data to display ____________________________________________  EKG   ____________________________________________  RADIOLOGY  ED MD interpretation: No foreign body on x-ray, no bony abnormality.  Official radiology report(s): Dg Finger Thumb Right  Result Date: 08/13/2017 CLINICAL DATA:  Right thumb pain after puncture wound. EXAM: RIGHT THUMB 2+V COMPARISON:  None. FINDINGS: There is no evidence of fracture or dislocation. There is no evidence of arthropathy or other focal bone abnormality. Soft tissues are unremarkable IMPRESSION: Normal right thumb. Electronically Signed   By: Lupita Raider, M.D.   On: 08/13/2017 08:44    ____________________________________________   PROCEDURES  Procedure(s) performed: None  Procedures  Critical Care performed: No  ____________________________________________   INITIAL IMPRESSION / ASSESSMENT AND PLAN / ED COURSE  As part of my medical decision making, I reviewed the following data within the electronic MEDICAL RECORD NUMBER    Pain secondary to small puncture wound right thumb.  Discussed x-ray findings with patient.  Patient given tetanus shot prior to departure.  Area was cleaned and bandaged.  Patient given discharge care instructions.      ____________________________________________   FINAL CLINICAL IMPRESSION(S) / ED DIAGNOSES  Final  diagnoses:  Puncture wound of right thumb without foreign body without damage to nail, initial encounter     ED Discharge Orders        Ordered    traMADol (ULTRAM) 50 MG tablet  Every 12 hours PRN     08/13/17 0844       Note:  This document was prepared using Dragon voice recognition software and may include unintentional dictation errors.    Joni Reining, PA-C 08/13/17 8295    Sharman Cheek, MD 08/13/17 [phone]

## 2017-08-13 NOTE — Discharge Instructions (Addendum)
Follow discharge care instruction take medication as directed.  No foreign body on x-ray of right thumb.

## 2017-08-13 NOTE — ED Notes (Signed)
Wound cleaned with betadine and dressed with sterile gauze.

## 2017-08-13 NOTE — ED Notes (Signed)
When asked if this was workmen's comp, patient declined.

## 2017-08-13 NOTE — ED Triage Notes (Signed)
Patient has puncture wound right thumb, states he was using a drill and drilled into thumb "with a real thin bit".  Complaining of pain.  Some swelling noted.

## 2017-08-16 ENCOUNTER — Emergency Department
Admission: EM | Admit: 2017-08-16 | Discharge: 2017-08-16 | Disposition: A | Discharge status: Home / Self Care | Payer: Self-pay | Attending: Emergency Medicine | Admitting: Emergency Medicine

## 2017-08-16 ENCOUNTER — Encounter: Payer: Self-pay | Admitting: Emergency Medicine

## 2017-08-16 DIAGNOSIS — F17210 Nicotine dependence, cigarettes, uncomplicated: Secondary | ICD-10-CM | POA: Insufficient documentation

## 2017-08-16 DIAGNOSIS — J45909 Unspecified asthma, uncomplicated: Secondary | ICD-10-CM | POA: Insufficient documentation

## 2017-08-16 DIAGNOSIS — L03011 Cellulitis of right finger: Secondary | ICD-10-CM | POA: Insufficient documentation

## 2017-08-16 DIAGNOSIS — W298XXD Contact with other powered powered hand tools and household machinery, subsequent encounter: Secondary | ICD-10-CM | POA: Insufficient documentation

## 2017-08-16 DIAGNOSIS — S61031D Puncture wound without foreign body of right thumb without damage to nail, subsequent encounter: Secondary | ICD-10-CM | POA: Insufficient documentation

## 2017-08-16 MED ORDER — LIDOCAINE HCL (PF) 1 % IJ SOLN
INTRAMUSCULAR | Status: AC
  Administered 2017-08-16: 5 mL
  Filled 2017-08-16: qty 5

## 2017-08-16 MED ORDER — CEPHALEXIN 500 MG PO CAPS: 500.0000 mg | ORAL_CAPSULE | Freq: Two times a day (BID) | ORAL | 0 refills | Status: AC

## 2017-08-16 MED ORDER — TRAMADOL HCL 50 MG PO TABS
50.0000 mg | ORAL_TABLET | Freq: Once | ORAL | Status: AC
Start: 2017-08-16 — End: 2017-08-16
  Administered 2017-08-16: 50 mg via ORAL
  Filled 2017-08-16: qty 1

## 2017-08-16 MED ORDER — LIDOCAINE HCL (PF) 1 % IJ SOLN
5.0000 mL | Freq: Once | INTRAMUSCULAR | Status: AC
  Administered 2017-08-16: 5 mL

## 2017-08-16 MED ORDER — TRAMADOL HCL 50 MG PO TABS: 50.0000 mg | ORAL_TABLET | Freq: Four times a day (QID) | ORAL | 0 refills | Status: AC | PRN

## 2017-08-16 NOTE — ED Triage Notes (Signed)
Patient presents to the ED with discoloration to the tip of his right thumb.  Patient accidentally drilled his right thumb on May 27th and a couple of days later was seen in the ED and received a tetanus shot.  Patient states, "I didn't get any antibiotics and now it's hurting worse."  Tip of thumb appears swollen and is bluish in color.

## 2017-08-16 NOTE — ED Notes (Signed)
See triage note  States he was seen last week d/t a drill going thru right thumb  Had x-ray and D-tap shot  Now thumb is swollen and tender

## 2017-08-16 NOTE — ED Provider Notes (Signed)
[facility] Emergency Department Provider Note  ____________________________________________  Time seen: Approximately 10:13 AM  I have reviewed the triage vital signs and the nursing notes.   HISTORY  Chief Complaint Hand Pain   HPI Johnny Thompson is a 42 y.o. male who presents to the emergency department for treatment and evaluation of right thumb pain. On May 27 or 28 he was using a drill and it punctured the tip of his thumb. He was evaluated here on May 30. X-ray and tdap completed that day. He now feels that his thumb is infected. It is increasingly painful. No alleviating measures attempted for this complaint prior to arrival.   Past Medical History:  Diagnosis Date  . Asthma   . Polysubstance (excluding opioids) dependence (HCC)     There are no active problems to display for this patient.   Past Surgical History:  Procedure Laterality Date  . ANKLE SURGERY    . HAND SURGERY      Prior to Admission medications   Medication Sig Start Date End Date Taking? Authorizing Provider  acetaminophen (TYLENOL) 500 MG tablet Take 1,000 mg by mouth every 6 (six) hours as needed for mild pain or moderate pain.    [provider]  albuterol (PROVENTIL HFA;VENTOLIN HFA) 108 (90 BASE) MCG/ACT inhaler Inhale 2 puffs into the lungs every 6 (six) hours as needed for wheezing or shortness of breath. 07/27/14   Menshew, Charlesetta Ivory, PA-C  amoxicillin-clavulanate (AUGMENTIN) 875-125 MG per tablet Take 1 tablet by mouth 2 (two) times daily. 07/27/14   Menshew, Charlesetta Ivory, PA-C  benzonatate (TESSALON PERLES) 100 MG capsule Take 1 capsule (100 mg total) by mouth 3 (three) times daily as needed for cough (Take 1-2 per dose). 07/27/14   Menshew, Charlesetta Ivory, PA-C  cephALEXin (KEFLEX) 500 MG capsule Take 1 capsule (500 mg total) by mouth 2 (two) times daily for 7 days. 08/16/17 08/23/17  Kynslee Baham, Kasandra Knudsen, FNP  fluticasone (FLONASE) 50 MCG/ACT nasal spray Place 1  spray into both nostrils daily. 07/27/14   Menshew, Charlesetta Ivory, PA-C  oxyCODONE-acetaminophen (ROXICET) 5-325 MG tablet Take 1 tablet by mouth every 6 (six) hours as needed. 03/17/15   Darien Ramus, MD  predniSONE (DELTASONE) 10 MG tablet Take 1 tablet (10 mg total) by mouth 2 (two) times daily with a meal. 07/27/14   Menshew, Charlesetta Ivory, PA-C  traMADol (ULTRAM) 50 MG tablet Take 1 tablet (50 mg total) by mouth every 6 (six) hours as needed. 08/16/17   Chinita Pester, FNP    Allergies Patient has no known allergies.  No family history on file.  Social History Social History   Tobacco Use  . Smoking status: Current Every Day Smoker  . Smokeless tobacco: Never Used  Substance Use Topics  . Alcohol use: Yes    Comment: last use 02/06/17  . Drug use: Not Currently    Types: Cocaine, Marijuana    Comment: pills    Review of Systems  Constitutional: Negative for fever. Respiratory: Negataive for cough or shortness of breath.  Musculoskeletal: Positive for right thumb and hand pain. Skin: Positive for fingertip swelling Neurological: Negative for numbness or paresthesias. ____________________________________________   PHYSICAL EXAM:  VITAL SIGNS: ED Triage Vitals  Enc Vitals Group     BP 08/16/17 0958 (!) 151/86     Pulse Rate 08/16/17 0958 79     Resp 08/16/17 0958 18     Temp 08/16/17 0958 98  F (36.7 C)     Temp Source 08/16/17 0958 Oral     SpO2 08/16/17 0958 93 %     Weight 08/16/17 0957 195 lb (88.5 kg)     Height 08/16/17 0957 5\' 11"  (1.803 m)     Head Circumference --      Peak Flow --      Pain Score 08/16/17 0956 10     Pain Loc --      Pain Edu? --      Excl. in GC? --      Constitutional: Well appearing. Eyes: Conjunctivae are clear without discharge or drainage. Nose: No rhinorrhea noted. Mouth/Throat: Airway is patent.  Neck: No stridor. Unrestricted range of motion observed.  Cardiovascular: Capillary refill is <3 seconds.   Respiratory: Respirations are even and unlabored.. Musculoskeletal: Unrestricted range of motion observed. Neurologic: Awake, alert, and oriented x 4.  Skin: Fingerpad of the right thumb  ____________________________________________   LABS (all labs ordered are listed, but only abnormal results are displayed)  Labs Reviewed - No data to display ____________________________________________  EKG  Not indicated. ____________________________________________  RADIOLOGY  Not indicated. ____________________________________________   PROCEDURES  .Marland Kitchen.Incision and Drainage Date/Time: 08/16/2017 10:45 AM Performed by: Chinita Pesterriplett, Deveron Shamoon B, FNP Authorized by: Chinita Pesterriplett, Eliora Nienhuis B, FNP   Consent:    Consent obtained:  Verbal   Consent given by:  Patient   Risks discussed:  Bleeding, infection, incomplete drainage and pain Location:    Type:  Fluid collection   Location:  Upper extremity   Upper extremity location:  Finger   Finger location:  R thumb Pre-procedure details:    Skin preparation:  Betadine Anesthesia (see MAR for exact dosages):    Anesthesia method:  Nerve block   Block needle gauge:  25 G   Block anesthetic:  Lidocaine 1% w/o epi   Block injection procedure:  Anatomic landmarks identified   Block outcome:  Anesthesia achieved Procedure type:    Complexity:  Complex Procedure details:    Incision types:  Stab incision   Incision depth:  Subcutaneous   Scalpel blade:  11   Wound management:  Probed and deloculated   Drainage:  Purulent   Drainage amount:  Moderate   Wound treatment:  Wound left open   Packing materials:  None Post-procedure details:    Patient tolerance of procedure:  Tolerated well, no immediate complications  ____________________________________________   INITIAL IMPRESSION / ASSESSMENT AND PLAN / ED COURSE  Johnny Thompson is a 42 y.o. male who presents to the emergency department for treatment and evaluation of fingertip infection.  Concern for early felon. I&D performed with return of malodorous, purulent drainage. He will be placed on antibiotic and encouraged to return to the ER if no improvement over the next 2 days or sooner if worsens.   Medications  lidocaine (PF) (XYLOCAINE) 1 % injection 5 mL (5 mLs Other Given by Other 08/16/17 1109)  traMADol (ULTRAM) tablet 50 mg (50 mg Oral Given 08/16/17 1112)     Pertinent labs & imaging results that were available during my care of the patient were reviewed by me and considered in my medical decision making (see chart for details). ____________________________________________   FINAL CLINICAL IMPRESSION(S) / ED DIAGNOSES  Final diagnoses:  Felon of finger of right hand    ED Discharge Orders        Ordered    cephALEXin (KEFLEX) 500 MG capsule  2 times daily     08/16/17 1056  traMADol (ULTRAM) 50 MG tablet  Every 6 hours PRN     08/16/17 1109       Note:  This document was prepared using Dragon voice recognition software and may include unintentional dictation errors.    Chinita Pester, FNP 08/17/17 1610    Jeanmarie Plant, MD 08/17/17 2252

## 2019-08-28 IMAGING — DX DG FINGER THUMB 2+V*R*
3 series · 3 of 3 positions shown · non-contrast
Comparison: None.

CLINICAL DATA: Right thumb pain after puncture wound.

EXAM:
RIGHT THUMB 2+V

[finger ap]
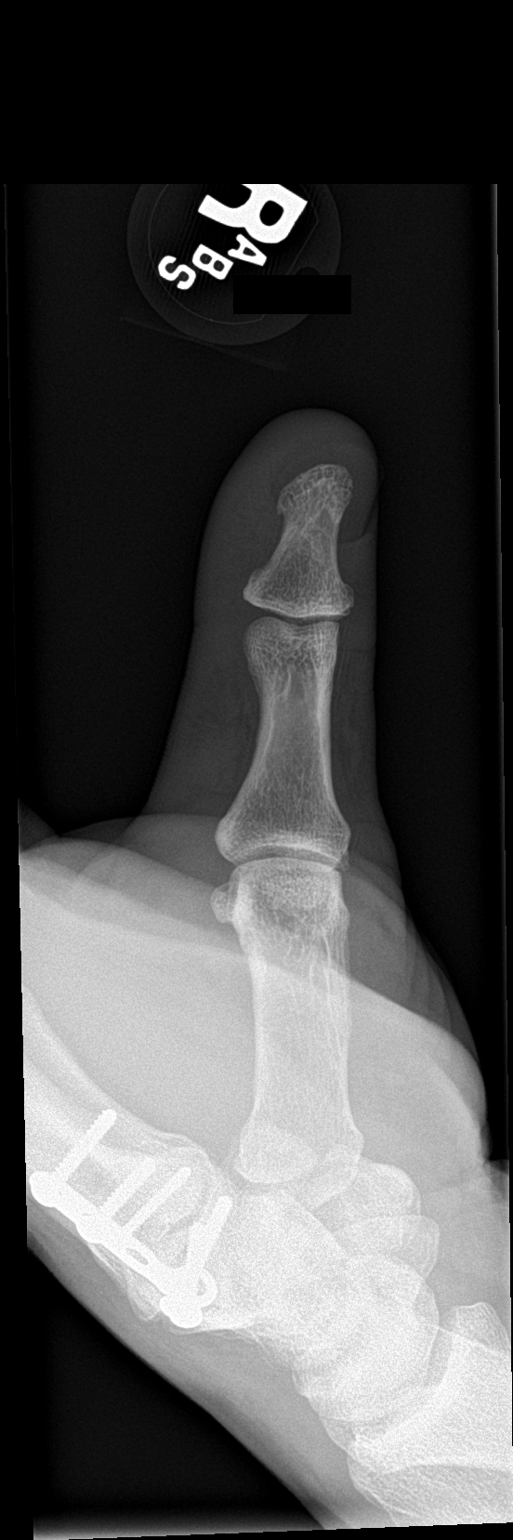

[finger obl]
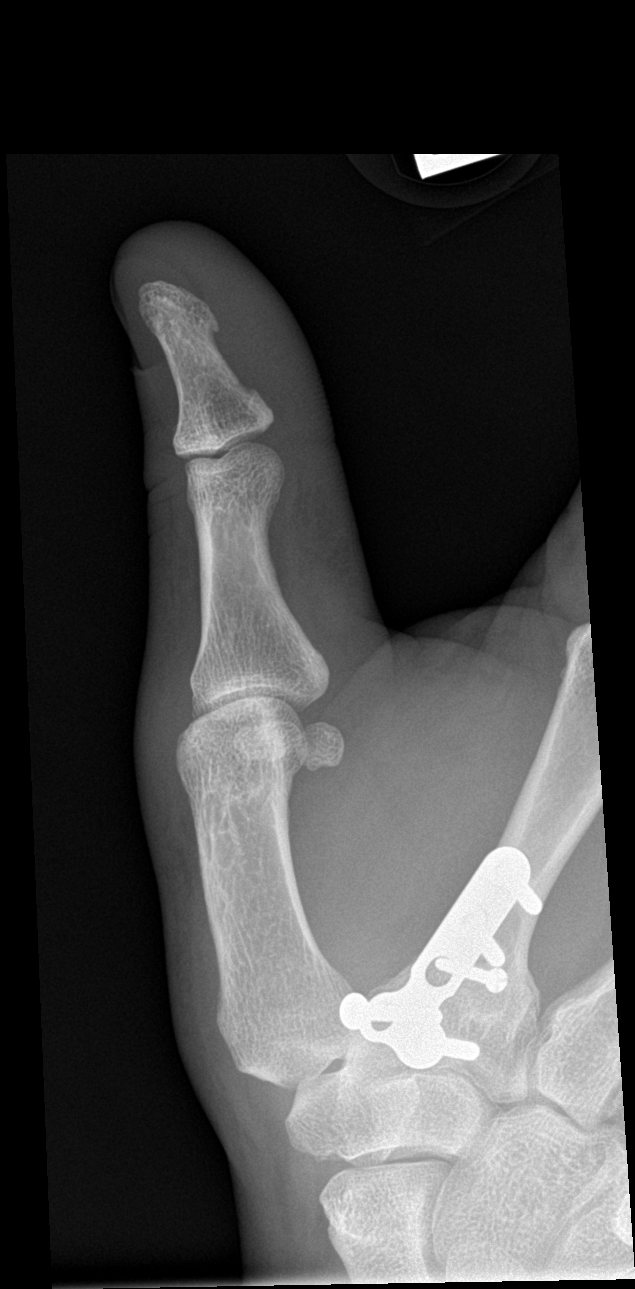

[finger lat]
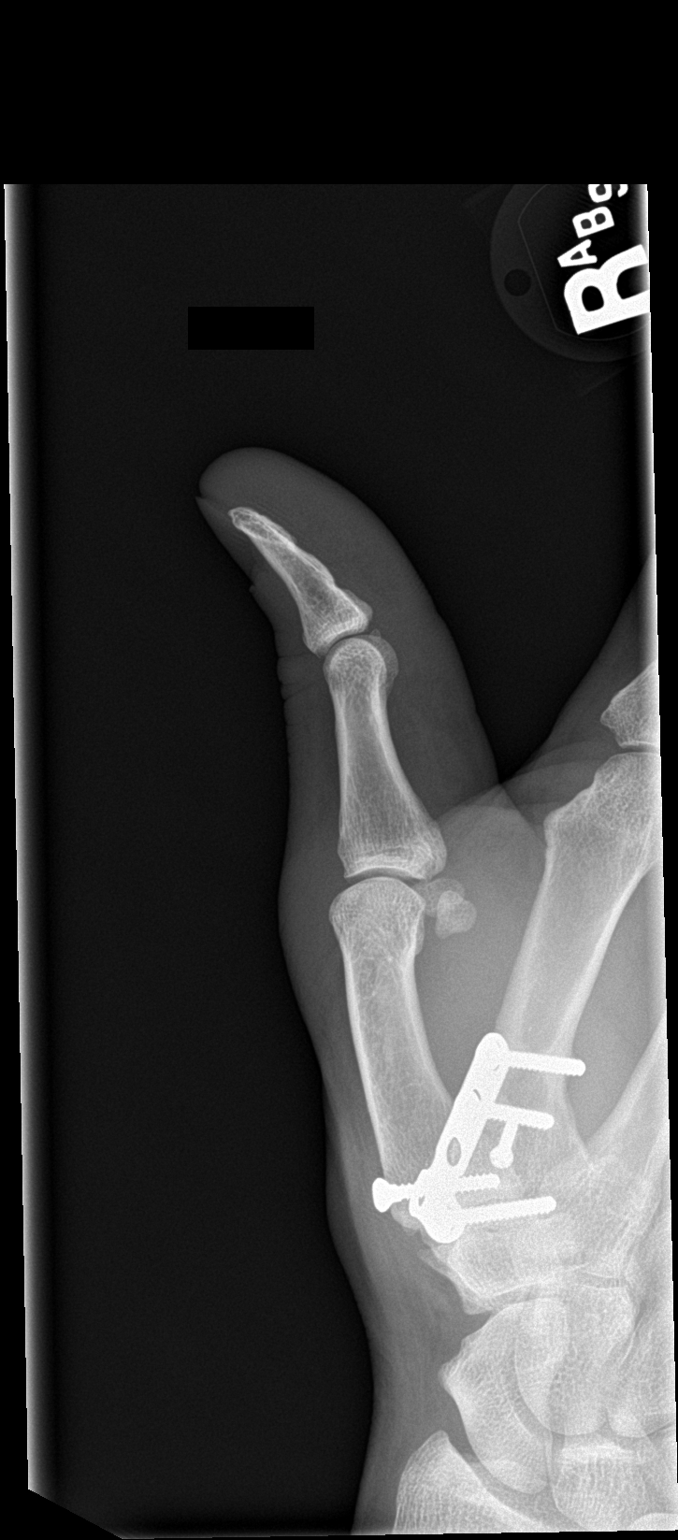

[3 of 3 positions shown; findings below may reference images not displayed]

FINDINGS: There is no evidence of fracture or dislocation. There is no
evidence of arthropathy or other focal bone abnormality. Soft
tissues are unremarkable
IMPRESSION: Normal right thumb.
